# Patient Record
Sex: Male | Born: 2000 | Race: Black or African American | Hispanic: No | Marital: Single | Smoking: Never smoker
Health system: Southern US, Community
[De-identification: ages and names within clinical notes are randomized; demographics above are authoritative.]

## PROBLEM LIST (undated history)

## (undated) DIAGNOSIS — J45909 Unspecified asthma, uncomplicated: Secondary | ICD-10-CM

---
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
## 2020-08-09 ENCOUNTER — Ambulatory Visit: Payer: No Typology Code available for payment source | Admitting: Medical

## 2021-01-03 IMAGING — CT CT MAXILLOFACIAL W/O CM
1 series · 15 of 30 positions shown, 19 images · non-contrast
Comparison: None.

CLINICAL DATA: Trauma, head struck by laundry cart, left
frontotemporal laceration.

EXAM:
CT HEAD WITHOUT CONTRAST
CT MAXILLOFACIAL WITHOUT CONTRAST
TECHNIQUE: Multidetector CT imaging of the head and maxillofacial structures
were performed using the standard protocol without intravenous
contrast. Multiplanar CT image reconstructions of the maxillofacial
structures were also generated.

[Series 2: max soft · axial · 0.32mm/px · z∈[-160,-14]mm · 15 of 79 slices shown, 19 images]
[im 3/79  brain]
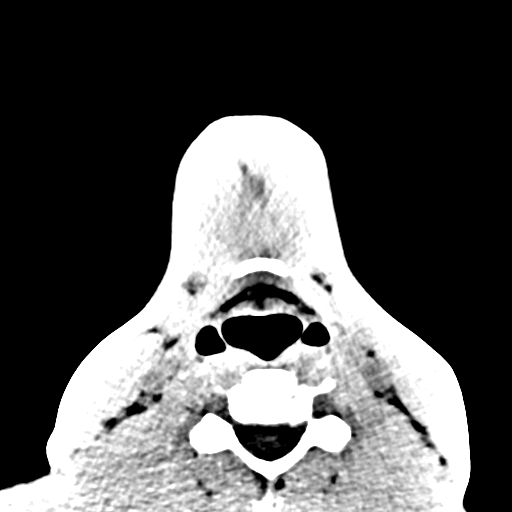
[im 3/79  bone]
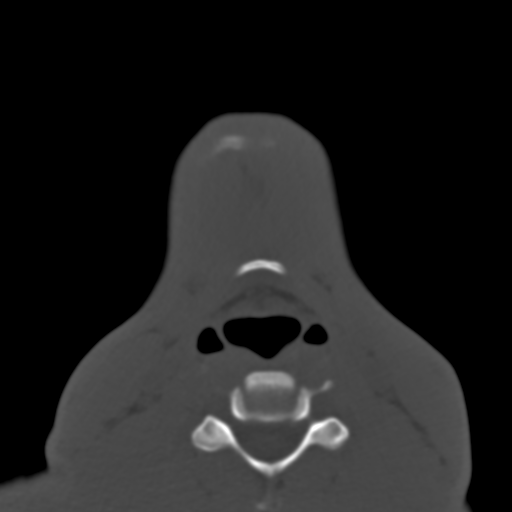
[im 9/79  bone]
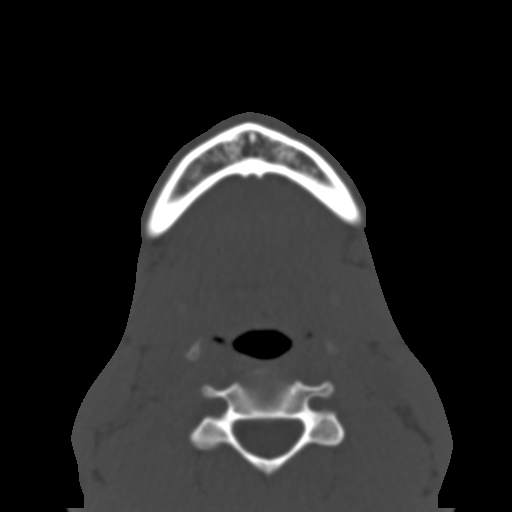
[im 14/79  bone]
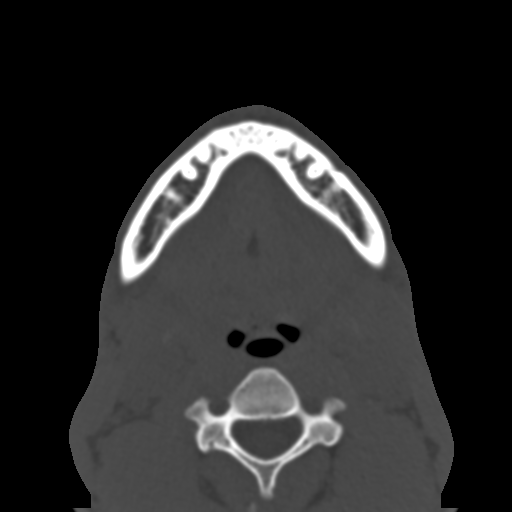
[im 19/79  bone]
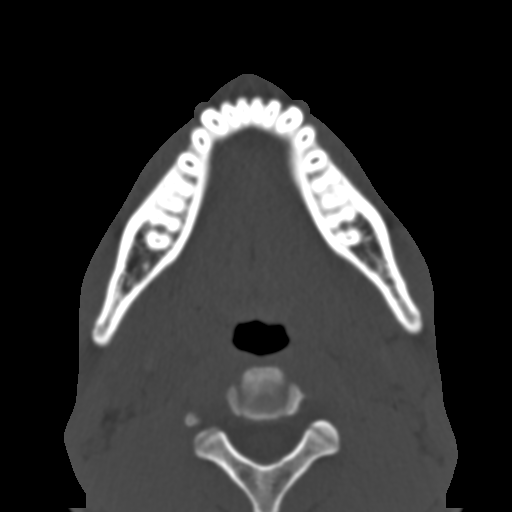
[im 25/79  brain]
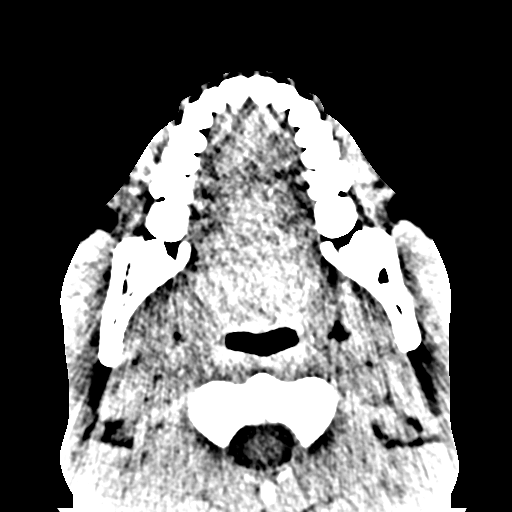
[im 25/79  bone]
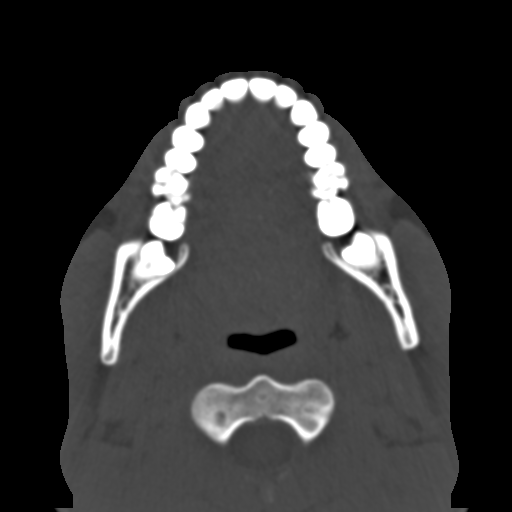
[im 30/79  bone]
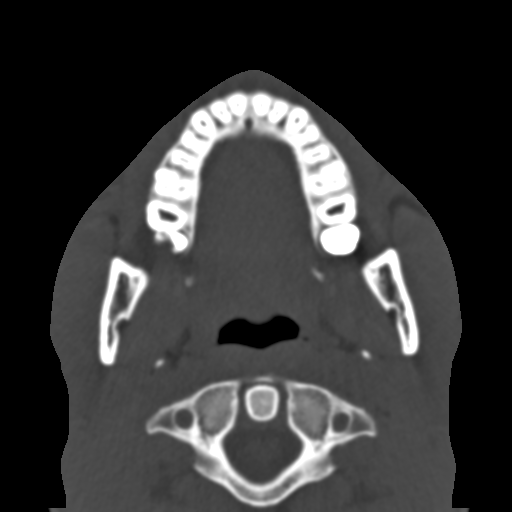
[im 35/79  bone]
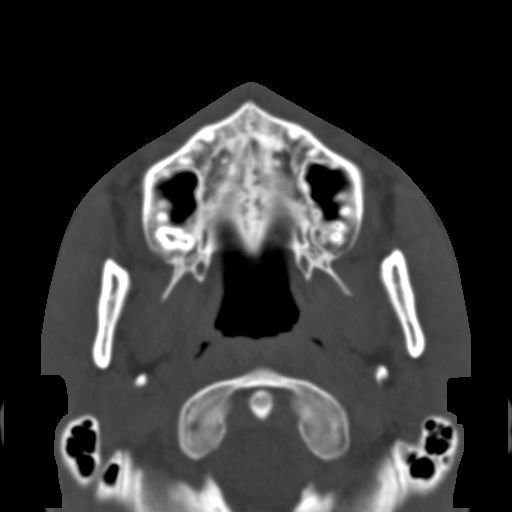
[im 41/79  bone]
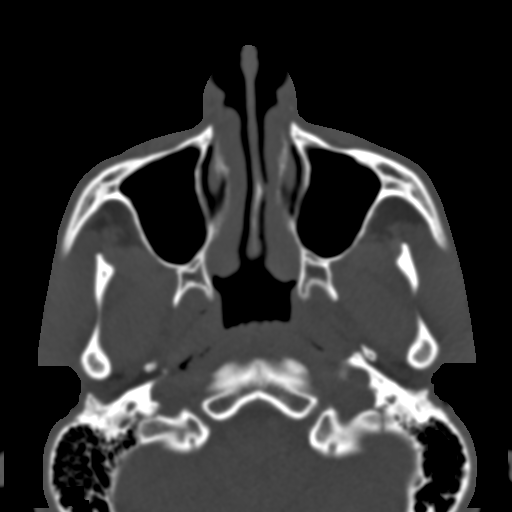
[im 44/79  brain]
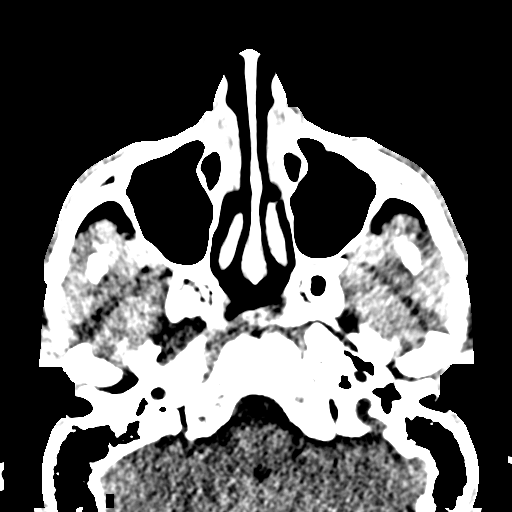
[im 44/79  bone]
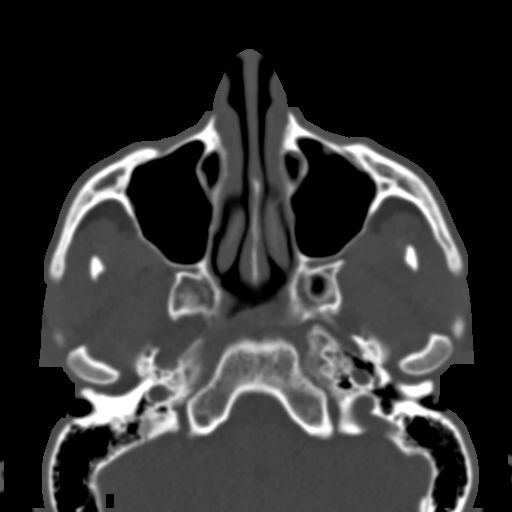
[im 49/79  bone]
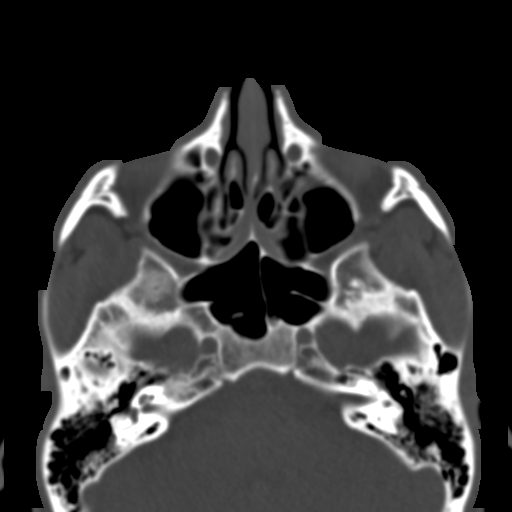
[im 54/79  bone]
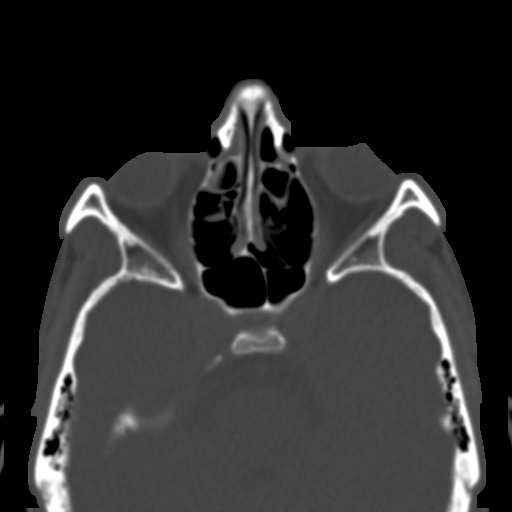
[im 60/79  bone]
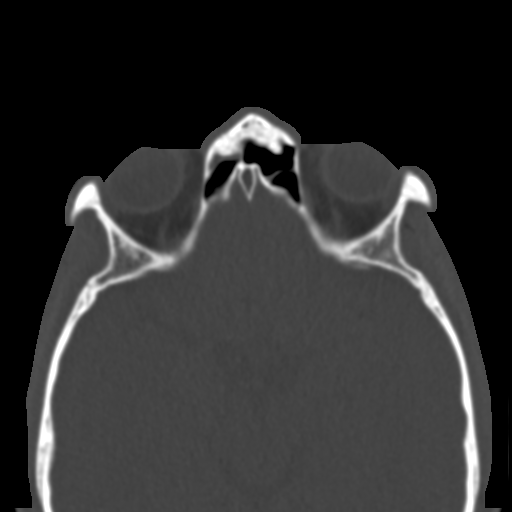
[im 65/79  brain]
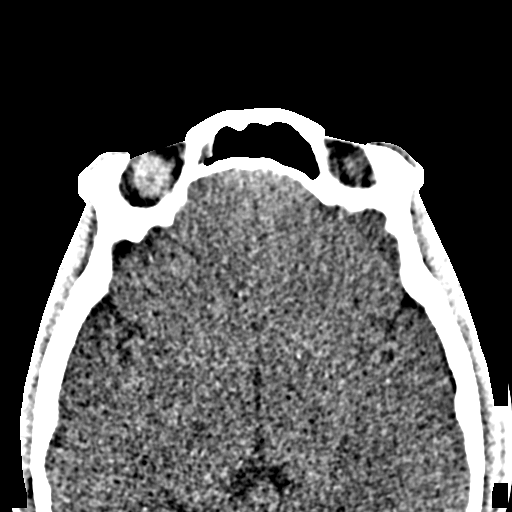
[im 65/79  bone]
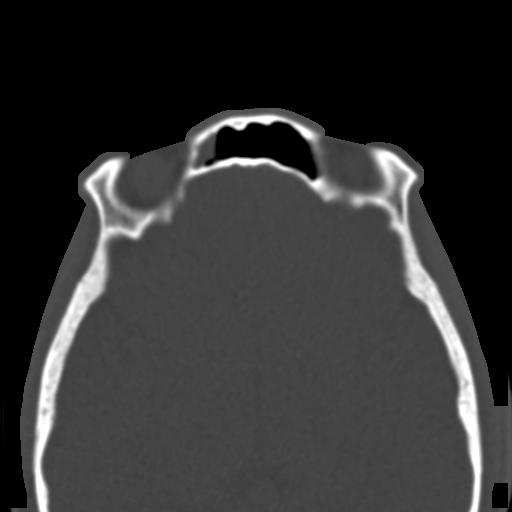
[im 70/79  bone]
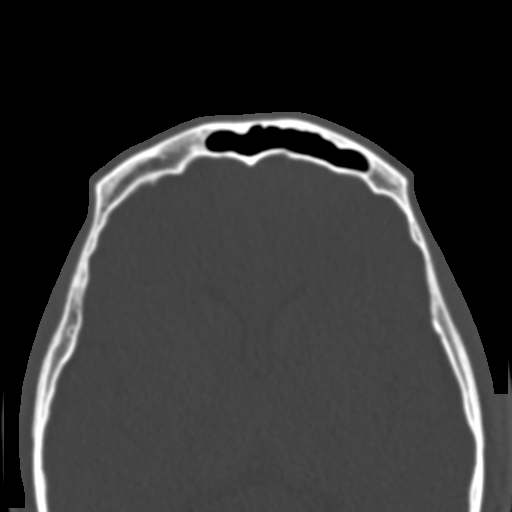
[im 76/79  bone]
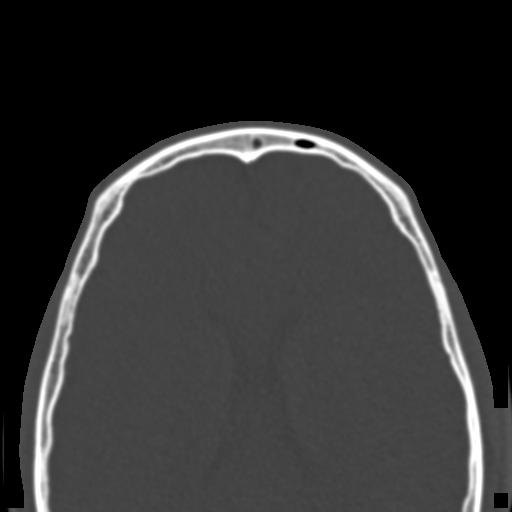

[15 of 30 positions shown; findings below may reference images not displayed]

FINDINGS: CT HEAD FINDINGS

Brain: No evidence of acute infarction, hemorrhage, hydrocephalus,
extra-axial collection or mass lesion/mass effect.

Vascular: No hyperdense vessel or unexpected calcification.

Skull: There is small amount of soft tissue thickening and cutaneous
laceration of the left frontal scalp. No large hematoma,
subcutaneous gas or foreign body is evident. No subjacent calvarial
fracture.

Other: None

CT MAXILLOFACIAL FINDINGS

Osseous: No fracture of the bony orbits. Nasal bones are intact. No
mid face fractures are seen. The pterygoid plates are intact. The
mandible is intact. Temporomandibular joints are normally aligned.
No temporal bone fractures are identified. No fractured or avulsed
teeth. Partial eruption of the third maxillary and mandibular
molars.

Orbits: The globes appear normal and symmetric. Symmetric appearance
of the extraocular musculature and optic nerve sheath complexes.
Normal caliber of the superior ophthalmic veins.

Sinuses: Minimal mural thickening in the ethmoids and left maxillary
sinus. Remaining paranasal sinuses and included portions of the
mastoid air cells are predominantly clear. Middle ear cavities and
external auditory canals are clear. Ossicular chains are normal
configuration.

Soft tissues: No significant facial swelling, subcutaneous gas or
foreign body. Punctate dermal calcifications are a benign incidental
finding.
IMPRESSION: 1. Small amount of soft tissue thickening and cutaneous laceration
of the left frontal scalp. No calvarial fracture.
2. No acute intracranial abnormality.
3. No significant maxillofacial soft tissue swelling or facial
fractures.

## 2023-12-04 ENCOUNTER — Encounter: Payer: Self-pay | Admitting: Advanced Practice Midwife
